# Patient Record
Sex: Female | Born: 1960 | Race: White | Hispanic: No | Marital: Married | Smoking: Current every day smoker
Health system: Southern US, Community
[De-identification: ages and names within clinical notes are randomized; demographics above are authoritative.]

## PROBLEM LIST (undated history)

## (undated) DIAGNOSIS — R51 Headache: Secondary | ICD-10-CM

## (undated) DIAGNOSIS — K635 Polyp of colon: Secondary | ICD-10-CM

## (undated) DIAGNOSIS — T7840XA Allergy, unspecified, initial encounter: Secondary | ICD-10-CM

## (undated) DIAGNOSIS — G43909 Migraine, unspecified, not intractable, without status migrainosus: Secondary | ICD-10-CM

## (undated) DIAGNOSIS — K219 Gastro-esophageal reflux disease without esophagitis: Secondary | ICD-10-CM

## (undated) DIAGNOSIS — R55 Syncope and collapse: Secondary | ICD-10-CM

## (undated) HISTORY — DX: Polyp of colon: K63.5

## (undated) HISTORY — DX: Allergy, unspecified, initial encounter: T78.40XA

## (undated) HISTORY — DX: Migraine, unspecified, not intractable, without status migrainosus: G43.909

## (undated) HISTORY — DX: Gastro-esophageal reflux disease without esophagitis: K21.9

## (undated) HISTORY — DX: Headache: R51

## (undated) HISTORY — DX: Syncope and collapse: R55

---

## 1983-11-12 HISTORY — PX: TONSILLECTOMY AND ADENOIDECTOMY: SHX28

## 1995-11-12 HISTORY — PX: NASAL SINUS SURGERY: SHX719

## 2010-11-11 HISTORY — PX: BREAST BIOPSY: SHX20

## 2011-09-27 ENCOUNTER — Other Ambulatory Visit: Payer: Self-pay | Admitting: Obstetrics and Gynecology

## 2011-09-27 DIAGNOSIS — N6009 Solitary cyst of unspecified breast: Secondary | ICD-10-CM

## 2011-09-30 ENCOUNTER — Other Ambulatory Visit: Payer: Self-pay | Admitting: Obstetrics and Gynecology

## 2011-10-02 ENCOUNTER — Other Ambulatory Visit: Payer: Self-pay | Admitting: Obstetrics and Gynecology

## 2011-10-02 ENCOUNTER — Ambulatory Visit
Admission: RE | Admit: 2011-10-02 | Discharge: 2011-10-02 | Disposition: A | Discharge status: Home / Self Care | Payer: BC Managed Care – PPO | Source: Ambulatory Visit | Attending: Obstetrics and Gynecology | Admitting: Obstetrics and Gynecology

## 2011-10-02 DIAGNOSIS — N6009 Solitary cyst of unspecified breast: Secondary | ICD-10-CM

## 2011-10-02 DIAGNOSIS — N6310 Unspecified lump in the right breast, unspecified quadrant: Secondary | ICD-10-CM

## 2012-03-11 HISTORY — PX: TOTAL ABDOMINAL HYSTERECTOMY W/ BILATERAL SALPINGOOPHORECTOMY: SHX83

## 2012-06-18 ENCOUNTER — Other Ambulatory Visit: Payer: Self-pay | Admitting: Obstetrics and Gynecology

## 2012-06-18 DIAGNOSIS — Z1231 Encounter for screening mammogram for malignant neoplasm of breast: Secondary | ICD-10-CM

## 2012-08-31 ENCOUNTER — Telehealth: Payer: Self-pay | Admitting: Internal Medicine

## 2012-08-31 NOTE — Telephone Encounter (Signed)
Only if spot is available.  Do not take up urgent slots.

## 2012-08-31 NOTE — Telephone Encounter (Signed)
Mr Claudia Young called wanting to see if we could move Claudia Young (his wife) appointment sooner than 12/23/12. Claudia Young went to her obgyn ([place]) Friday.  They told her she was anemic and her hemoglobin count was down didn't know what it was Pt just had hystermotery in July.   Spouse stated Claudia Young is very tired night has sweats not feeling good Please advise if we can see sooner.

## 2012-09-01 NOTE — Telephone Encounter (Signed)
Spoke with Mr. Claudia Young about appointment that was moved up to December.

## 2012-09-25 ENCOUNTER — Ambulatory Visit
Admission: RE | Admit: 2012-09-25 | Discharge: 2012-09-25 | Disposition: A | Discharge status: Home / Self Care | Payer: BC Managed Care – PPO | Source: Ambulatory Visit | Attending: Obstetrics and Gynecology | Admitting: Obstetrics and Gynecology

## 2012-09-25 DIAGNOSIS — Z1231 Encounter for screening mammogram for malignant neoplasm of breast: Secondary | ICD-10-CM

## 2012-10-28 ENCOUNTER — Encounter: Payer: Self-pay | Admitting: Internal Medicine

## 2012-10-28 ENCOUNTER — Ambulatory Visit (INDEPENDENT_AMBULATORY_CARE_PROVIDER_SITE_OTHER): Payer: BC Managed Care – PPO | Admitting: Internal Medicine

## 2012-10-28 VITALS — BP 98/60 | HR 97 | Temp 98.2°F | Resp 12 | Ht 61.0 in | Wt 149.8 lb

## 2012-10-28 DIAGNOSIS — E559 Vitamin D deficiency, unspecified: Secondary | ICD-10-CM

## 2012-10-28 DIAGNOSIS — Z7189 Other specified counseling: Secondary | ICD-10-CM

## 2012-10-28 DIAGNOSIS — Z13220 Encounter for screening for lipoid disorders: Secondary | ICD-10-CM

## 2012-10-28 DIAGNOSIS — F4320 Adjustment disorder, unspecified: Secondary | ICD-10-CM

## 2012-10-28 DIAGNOSIS — J329 Chronic sinusitis, unspecified: Secondary | ICD-10-CM

## 2012-10-28 DIAGNOSIS — Z716 Tobacco abuse counseling: Secondary | ICD-10-CM

## 2012-10-28 DIAGNOSIS — K219 Gastro-esophageal reflux disease without esophagitis: Secondary | ICD-10-CM

## 2012-10-28 DIAGNOSIS — D649 Anemia, unspecified: Secondary | ICD-10-CM

## 2012-10-28 DIAGNOSIS — F17200 Nicotine dependence, unspecified, uncomplicated: Secondary | ICD-10-CM

## 2012-10-28 DIAGNOSIS — D509 Iron deficiency anemia, unspecified: Secondary | ICD-10-CM

## 2012-10-28 DIAGNOSIS — R5383 Other fatigue: Secondary | ICD-10-CM

## 2012-10-28 DIAGNOSIS — F4321 Adjustment disorder with depressed mood: Secondary | ICD-10-CM

## 2012-10-28 DIAGNOSIS — N951 Menopausal and female climacteric states: Secondary | ICD-10-CM

## 2012-10-28 LAB — CBC WITH DIFFERENTIAL/PLATELET
Basophils Absolute: 0.1 10*3/uL (ref 0.0–0.1)
Hemoglobin: 12.2 g/dL (ref 12.0–15.0)
Lymphocytes Relative: 23.4 % (ref 12.0–46.0)
Monocytes Relative: 6.2 % (ref 3.0–12.0)
Neutro Abs: 6.5 10*3/uL (ref 1.4–7.7)
Neutrophils Relative %: 68.8 % (ref 43.0–77.0)
RBC: 4.72 Mil/uL (ref 3.87–5.11)
RDW: 26.1 % — ABNORMAL HIGH (ref 11.5–14.6)

## 2012-10-28 LAB — COMPREHENSIVE METABOLIC PANEL
ALT: 20 U/L (ref 0–35)
AST: 20 U/L (ref 0–37)
Albumin: 4.6 g/dL (ref 3.5–5.2)
Calcium: 9.6 mg/dL (ref 8.4–10.5)
Chloride: 107 mEq/L (ref 96–112)
Potassium: 4.5 mEq/L (ref 3.5–5.1)

## 2012-10-28 LAB — IRON AND TIBC
%SAT: 7 % — ABNORMAL LOW (ref 20–55)
TIBC: 391 ug/dL (ref 250–470)
UIBC: 364 ug/dL (ref 125–400)

## 2012-10-28 LAB — LIPID PANEL
Cholesterol: 216 mg/dL — ABNORMAL HIGH (ref 0–200)
HDL: 53.9 mg/dL (ref >39.00)
Total CHOL/HDL Ratio: 4
VLDL: 13 mg/dL (ref 0.0–40.0)

## 2012-10-28 LAB — FERRITIN: Ferritin: 10.9 ng/mL (ref 10.0–291.0)

## 2012-10-28 MED ORDER — OXYCODONE-ACETAMINOPHEN 7.5-325 MG PO TABS: 1.0000 | ORAL_TABLET | ORAL | Status: DC | PRN

## 2012-10-28 MED ORDER — CLONAZEPAM 1 MG PO TABS: 1.0000 mg | ORAL_TABLET | Freq: Two times a day (BID) | ORAL | Status: DC | PRN

## 2012-10-28 MED ORDER — OMEPRAZOLE 40 MG PO CPDR: 40.0000 mg | DELAYED_RELEASE_CAPSULE | Freq: Every day | ORAL | Status: DC

## 2012-10-28 NOTE — Patient Instructions (Addendum)
You can continue to use the Klonipin twice daily for your nerves  But if you are still needing it that often in another month or so we should talk about a safer alternative   CT of sinuses prior to ENT evaluation   Increase your salt water flushes to twice daily  Resume your steroid nasal spray   We will notify you of your lab results.

## 2012-10-28 NOTE — Progress Notes (Signed)
Patient ID: Claudia Young, female   DOB: [birth year], 51 y.o.   MRN: [id number]    Patient Active Problem List  Diagnosis  . Anemia, iron deficiency  . Chronic sinusitis  . Grief reaction  . Tobacco abuse counseling    Subjective:  CC:   Chief Complaint  Patient presents with  . Establish Care    HPI:   Claudia Young is a 51 y.o. female who presents as a new patient to establish primary care with the chief complaint of  1) Grief .  She was recently widowed.  Her husband Claudia Young died less than one month ago at home from an  AMI.  She has been sleeping fitfully and tking clonazepam for nerves since husband died.  Using prn twice daily at most 3 on weekend.  2) Chronic sinus pain and congestion.  History of 2 prior surgeries done at [facility] in [place], but her last surgery was over 15 yrs ago for polyps.  Persistent right maxillary pain.  Had a dental exam recently with pantograms to rule out teeth as source. 3) Anemia  Has ahistory of iron deficiency due to DUB.  She is now s.p hysterectomy/BSO  Last hgb was 9 end of oct. She has had difficulty adhering to tid oral iron due to nausea from the medication.  4) chronic pain.  She has  been taking percocet lately for headaches  and shoulder pain which have been worse lately.     Past Medical History  Diagnosis Date  . Fainting spell   . Headache   . GERD (gastroesophageal reflux disease)   . Allergy   . Migraine   . Colon polyps     Past Surgical History  Procedure Date  . Breast biopsy 2012  . Tonsillectomy and adenoidectomy 1985  . Nasal sinus surgery 1997  . Total abdominal hysterectomy w/ bilateral salpingoophorectomy May 2013    [facility] , [place]    Family History  Problem Relation Age of Onset  . Hypertension Mother   . Hyperlipidemia Mother   . Cancer Father 68    prostate  . Cancer Maternal Aunt     breast  . Cancer Paternal Grandmother     breast   No Known Allergies  History   Social History   . Marital Status: Married    Spouse Name: N/A    Number of Children: N/A  . Years of Education: N/A   Occupational History  . Not on file.   Social History Main Topics  . Smoking status: Current Every Day Smoker  . Smokeless tobacco: Not on file  . Alcohol Use: No  . Drug Use: No  . Sexually Active: Yes -- Female partner(s)    Birth Control/ Protection: Surgical   Other Topics Concern  . 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     Objective:  BP 98/60  Pulse 97  Temp 98.2 F (36.8 C) (Oral)  Resp 12  Ht 5\' 1"  (1.549 m)  Wt 149 lb 12 oz (67.926 kg)  BMI 28.29 kg/m2  SpO2 93%  General appearance: alert, cooperative and appears stated age Ears: normal TM's and external ear canals both ears Throat: lips, mucosa, and tongue normal; teeth and gums  normal Neck: no adenopathy, no carotid bruit, supple, symmetrical, trachea midline and thyroid not enlarged, symmetric, no tenderness/mass/nodules Back: symmetric, no curvature. ROM normal. No CVA tenderness. Lungs: clear to auscultation bilaterally Heart: regular rate and rhythm, S1, S2 normal, no murmur, click, rub or gallop Abdomen: soft, non-tender; bowel sounds normal; no masses,  no organomegaly Pulses: 2+ and symmetric Skin: Skin color, texture, turgor normal. No rashes or lesions Lymph nodes: Cervical, supraclavicular, and axillary nodes normal.  Assessment and Plan:  Anemia, iron deficiency Presumed secondary toRepeat hemoglobin is normal at 12 despite patient's inability to tolerate ferrous sulfate. Her iron studies remain low we will try a course of tendon.  gynecologic blood losses since EGD and colonoscopy were unrevealing.  Chronic sinusitis CT of sinuses ordered and referral to [facility] .  Recommended to flush sinuses twice daily as simply saline and consider  Grief reaction She has been taking Klonopin 2-3 times daily since the death of her husband  Claudia Young, .which was quite sudden.  I discussed the nature of this medication in the high date is potential that it has. Suggested that she come back in one month and if she is still requiring use of the medication on  twice daily basis that we should start an SSRI and try to taper her use to once daily.  Tobacco abuse counseling Spent 3 minutes discussing risk of continued tobacco abuse, including but not limited to CAD, PAD, hypertension, and CA.   Given her concurrent grief and depression/anxiety we discussed possibility of a trial of Wellbutrin to her next visit.  Menopause syndrome She is using estrogen gel for vaginal atrophy. Oral estrogen is contraindicated due to her current tobacco use.   Updated Medication List Outpatient Encounter Prescriptions as of 10/28/2012  Medication Sig Dispense Refill  . clonazePAM (KLONOPIN) 1 MG tablet Take 1 tablet (1 mg total) by mouth 2 (two) times daily as needed.  60 tablet  2  . Estradiol (ELESTRIN) 0.52 MG/0.87 GM (0.06%) GEL Apply topically.      Marland Kitchen oxyCODONE-acetaminophen (PERCOCET) 7.5-325 MG per tablet Take 1 tablet by mouth every 4 (four) hours as needed.  30 tablet  0  . [DISCONTINUED] clonazePAM (KLONOPIN) 1 MG tablet Take 1 mg by mouth 2 (two) times daily as needed.      . [DISCONTINUED] ferrous sulfate 325 (65 FE) MG tablet Take 325 mg by mouth daily with breakfast.      . [DISCONTINUED] omeprazole (PRILOSEC) 20 MG capsule Take 20 mg by mouth daily.      . [DISCONTINUED] oxyCODONE-acetaminophen (PERCOCET) 7.5-325 MG per tablet Take 1 tablet by mouth every 4 (four) hours as needed.      . Ferrous Fum-Iron Polysacch-FA 162-115.2-1 MG CAPS Take 1 capsule by mouth daily with supper.  30 each  1  . omeprazole (PRILOSEC) 40 MG capsule Take 1 capsule (40 mg total) by mouth daily.  30  capsule  3

## 2012-10-29 ENCOUNTER — Encounter: Payer: Self-pay | Admitting: Internal Medicine

## 2012-10-29 DIAGNOSIS — D509 Iron deficiency anemia, unspecified: Secondary | ICD-10-CM | POA: Insufficient documentation

## 2012-10-29 DIAGNOSIS — N951 Menopausal and female climacteric states: Secondary | ICD-10-CM | POA: Insufficient documentation

## 2012-10-29 DIAGNOSIS — F4321 Adjustment disorder with depressed mood: Secondary | ICD-10-CM | POA: Insufficient documentation

## 2012-10-29 DIAGNOSIS — J329 Chronic sinusitis, unspecified: Secondary | ICD-10-CM | POA: Insufficient documentation

## 2012-10-29 DIAGNOSIS — Z716 Tobacco abuse counseling: Secondary | ICD-10-CM | POA: Insufficient documentation

## 2012-10-29 MED ORDER — FERROUS FUM-IRON POLYSACCH-FA 162-115.2-1 MG PO CAPS: 1.0000 | ORAL_CAPSULE | Freq: Every day | ORAL | Status: DC

## 2012-10-29 NOTE — Assessment & Plan Note (Signed)
Spent 3 minutes discussing risk of continued tobacco abuse, including but not limited to CAD, PAD, hypertension, and CA.   Given her concurrent grief and depression/anxiety we discussed possibility of a trial of Wellbutrin to her next visit.

## 2012-10-29 NOTE — Assessment & Plan Note (Signed)
She is using estrogen gel for vaginal atrophy. Oral estrogen is contraindicated due to her current tobacco use.

## 2012-10-29 NOTE — Assessment & Plan Note (Signed)
Presumed secondary toRepeat hemoglobin is normal at 12 despite patient's inability to tolerate ferrous sulfate. Her iron studies remain low we will try a course of tendon.  gynecologic blood losses since EGD and colonoscopy were unrevealing.

## 2012-10-29 NOTE — Assessment & Plan Note (Signed)
She has been taking Klonopin 2-3 times daily since the death of her husband  Claudia Young, .which was quite sudden.  I discussed the nature of this medication in the high date is potential that it has. Suggested that she come back in one month and if she is still requiring use of the medication on  twice daily basis that we should start an SSRI and try to taper her use to once daily.

## 2012-10-29 NOTE — Assessment & Plan Note (Addendum)
CT of sinuses ordered and referral to [facility] ENT . Recommended to flush sinuses twice daily as simply saline and consider

## 2012-10-30 ENCOUNTER — Other Ambulatory Visit: Payer: Self-pay

## 2012-11-02 ENCOUNTER — Telehealth: Payer: Self-pay | Admitting: Internal Medicine

## 2012-11-02 ENCOUNTER — Ambulatory Visit: Payer: Self-pay | Admitting: Internal Medicine

## 2012-11-02 NOTE — Telephone Encounter (Signed)
Called pt. LMOVM 

## 2012-11-02 NOTE — Telephone Encounter (Signed)
Does Pt need to keep appt with ENT?

## 2012-11-02 NOTE — Telephone Encounter (Signed)
I think this was supposed to come to you.  thanks

## 2012-11-02 NOTE — Telephone Encounter (Signed)
CT sinuses showed mucoperiosteal thickening, but no air fluid levels consistent with acute sinusitis.

## 2012-11-02 NOTE — Telephone Encounter (Signed)
Yes, please have pt keep appt with ENT at [facility] as changes are consistent with chronic inflammation in the sinuses.

## 2012-11-03 NOTE — Telephone Encounter (Signed)
Left message asking patient to call back

## 2012-11-03 NOTE — Telephone Encounter (Signed)
Pt notified to keep appt.

## 2012-11-18 ENCOUNTER — Encounter: Payer: Self-pay | Admitting: Internal Medicine

## 2012-11-24 ENCOUNTER — Other Ambulatory Visit: Payer: Self-pay | Admitting: General Practice

## 2012-11-24 DIAGNOSIS — D509 Iron deficiency anemia, unspecified: Secondary | ICD-10-CM

## 2012-11-24 MED ORDER — FERROUS FUM-IRON POLYSACCH-FA 162-115.2-1 MG PO CAPS: 1.0000 | ORAL_CAPSULE | Freq: Every day | ORAL | Status: DC

## 2012-11-25 ENCOUNTER — Telehealth: Payer: Self-pay | Admitting: General Practice

## 2012-11-25 MED ORDER — TANDEM PLUS 162-115.2-1 MG PO CAPS: 1.0000 | ORAL_CAPSULE | Freq: Every day | ORAL | Status: DC

## 2012-11-25 NOTE — Telephone Encounter (Signed)
Rx sent. If they do not have the alternative,  Tell them to pick an iron supplement they DO have.

## 2012-11-25 NOTE — Telephone Encounter (Signed)
Pharmacy called stating the Rx sent to them regarding Ferrous Fum-Iron Polysacch-FA 162-115.2-1 MG CAPS cannot be filled. The requested med be changed to something else.

## 2012-11-26 NOTE — Telephone Encounter (Signed)
Pt pharmacy called and advised.

## 2012-12-04 ENCOUNTER — Other Ambulatory Visit: Payer: Self-pay | Admitting: *Deleted

## 2012-12-04 ENCOUNTER — Other Ambulatory Visit: Payer: Self-pay | Admitting: Internal Medicine

## 2012-12-04 DIAGNOSIS — K219 Gastro-esophageal reflux disease without esophagitis: Secondary | ICD-10-CM

## 2012-12-04 MED ORDER — OMEPRAZOLE 40 MG PO CPDR: 40.0000 mg | DELAYED_RELEASE_CAPSULE | Freq: Every day | ORAL | Status: DC

## 2012-12-04 NOTE — Addendum Note (Signed)
Addended by: Jackson Latino on: 12/04/2012 05:55 PM   Modules accepted: Orders

## 2012-12-04 NOTE — Telephone Encounter (Signed)
Form given to [facility]

## 2012-12-04 NOTE — Telephone Encounter (Signed)
Have not seen this pt

## 2012-12-04 NOTE — Telephone Encounter (Signed)
Forwarded to Dr. Lorin Picket

## 2012-12-04 NOTE — Telephone Encounter (Signed)
omeprazole (PRILOSEC) 40 MG capsule  # 30  Prior authorization request  In physicians box

## 2012-12-10 ENCOUNTER — Telehealth: Payer: Self-pay | Admitting: *Deleted

## 2012-12-10 NOTE — Telephone Encounter (Signed)
Called [phone] for prior authorization form is being faxed over case # [id number]

## 2012-12-15 ENCOUNTER — Other Ambulatory Visit: Payer: Self-pay | Admitting: General Practice

## 2012-12-15 DIAGNOSIS — K219 Gastro-esophageal reflux disease without esophagitis: Secondary | ICD-10-CM

## 2012-12-15 MED ORDER — OMEPRAZOLE 40 MG PO CPDR: 40.0000 mg | DELAYED_RELEASE_CAPSULE | Freq: Every day | ORAL | Status: DC

## 2012-12-15 NOTE — Telephone Encounter (Signed)
PA for Omeprazole has been approved from 11/13/12 until 12/14/13

## 2012-12-21 ENCOUNTER — Telehealth: Payer: Self-pay | Admitting: General Practice

## 2012-12-21 DIAGNOSIS — M26609 Unspecified temporomandibular joint disorder, unspecified side: Secondary | ICD-10-CM | POA: Insufficient documentation

## 2012-12-21 MED ORDER — NABUMETONE 750 MG PO TABS: 750.0000 mg | ORAL_TABLET | Freq: Two times a day (BID) | ORAL | Status: DC | PRN

## 2012-12-21 MED ORDER — METHOCARBAMOL 500 MG PO TABS: 500.0000 mg | ORAL_TABLET | Freq: Three times a day (TID) | ORAL | Status: DC

## 2012-12-21 NOTE — Telephone Encounter (Signed)
TMJ usually responds to anti inflammatories and muscle relaxers,  I will call one of each to her pharmacy and let her try them. The muscle relaxer  (methocarbamol) may make her sleepy so she should save it for night.

## 2012-12-21 NOTE — Addendum Note (Signed)
Addended by: Sherlene Shams on: 12/21/2012 05:30 PM   Modules accepted: Orders

## 2012-12-21 NOTE — Telephone Encounter (Signed)
See unrouted response 

## 2012-12-21 NOTE — Telephone Encounter (Signed)
Pt called stating she has been getting bad stress headaches and was wondering if anything could be called in.   Pt had an ENT appointment today stated that it wasn't sinuses that are causing pain in face thought it was TMJ.

## 2012-12-22 NOTE — Telephone Encounter (Signed)
Pt.notified

## 2012-12-23 ENCOUNTER — Ambulatory Visit: Payer: BC Managed Care – PPO | Admitting: Internal Medicine

## 2013-01-28 ENCOUNTER — Telehealth: Payer: Self-pay | Admitting: Internal Medicine

## 2013-01-28 NOTE — Telephone Encounter (Signed)
Claudia Young spoke with pt  Pt aware of appointment change

## 2013-01-28 NOTE — Telephone Encounter (Signed)
Left message for Claudia Young to call office Please advise Claudia Young appointment for 3/21 with dr Lorin Picket has been cancel and r/s with dr Darrick Huntsman on 02/01/13 @ 10:30

## 2013-01-29 ENCOUNTER — Ambulatory Visit: Payer: BC Managed Care – PPO | Admitting: Internal Medicine

## 2013-02-01 ENCOUNTER — Ambulatory Visit: Payer: BC Managed Care – PPO | Admitting: Internal Medicine

## 2013-02-05 ENCOUNTER — Ambulatory Visit: Payer: BC Managed Care – PPO | Admitting: Internal Medicine

## 2013-03-22 ENCOUNTER — Other Ambulatory Visit: Payer: Self-pay | Admitting: Internal Medicine

## 2013-03-22 NOTE — Telephone Encounter (Signed)
Please tell ms harris that she needs to reschedule the appt she [facility] and I will refill the clonazepam for one month.  We discussed the addictive potential of this medication at her first visit.  She is recently widowed and may need alternative safer medication to use long term.

## 2013-03-22 NOTE — Telephone Encounter (Signed)
Left message for patient to call office on voice mail 

## 2013-03-23 ENCOUNTER — Other Ambulatory Visit: Payer: Self-pay | Admitting: Internal Medicine

## 2013-03-23 NOTE — Telephone Encounter (Signed)
Needs follow up first

## 2013-03-23 NOTE — Telephone Encounter (Signed)
Patient notified medication cannot be refilled until appointment.

## 2013-03-23 NOTE — Telephone Encounter (Signed)
Patient scheduled follw up appointment for medication refill.

## 2013-03-23 NOTE — Telephone Encounter (Signed)
Patient has not had office visit since 12/13 has scheduled visit 04/16/13 please advise.

## 2013-03-23 NOTE — Telephone Encounter (Signed)
Called patient to advise of need for follow up left message to call office.

## 2013-03-25 NOTE — Telephone Encounter (Signed)
Left message for patient to return call on home voicemail.  

## 2013-04-16 ENCOUNTER — Ambulatory Visit (INDEPENDENT_AMBULATORY_CARE_PROVIDER_SITE_OTHER): Payer: BC Managed Care – PPO | Admitting: Internal Medicine

## 2013-04-16 ENCOUNTER — Encounter: Payer: Self-pay | Admitting: Internal Medicine

## 2013-04-16 VITALS — BP 106/68 | HR 74 | Temp 98.7°F | Resp 16 | Wt 150.5 lb

## 2013-04-16 DIAGNOSIS — M26609 Unspecified temporomandibular joint disorder, unspecified side: Secondary | ICD-10-CM

## 2013-04-16 DIAGNOSIS — F329 Major depressive disorder, single episode, unspecified: Secondary | ICD-10-CM | POA: Insufficient documentation

## 2013-04-16 DIAGNOSIS — J329 Chronic sinusitis, unspecified: Secondary | ICD-10-CM

## 2013-04-16 MED ORDER — AMITRIPTYLINE HCL 25 MG PO TABS: 25.0000 mg | ORAL_TABLET | Freq: Every day | ORAL | Status: DC

## 2013-04-16 MED ORDER — MOMETASONE FUROATE 50 MCG/ACT NA SUSP: 4.0000 | Freq: Every day | NASAL | Status: DC

## 2013-04-16 MED ORDER — BUPROPION HCL ER (SR) 100 MG PO TB12: 100.0000 mg | ORAL_TABLET | Freq: Two times a day (BID) | ORAL | Status: DC

## 2013-04-16 MED ORDER — ALPRAZOLAM 0.5 MG PO TABS: 0.5000 mg | ORAL_TABLET | Freq: Two times a day (BID) | ORAL | Status: DC | PRN

## 2013-04-16 NOTE — Patient Instructions (Addendum)
I am recommending a trial of wellbutrin,  An antidepressant,  And alprazolam for your insomnia.  The wellbutrin will help your energy level and your tobacco cravings.   For your sinuses and alleriges,  I recommend trial of nasonex steorid spray 2 squirts in reach nostril daily   Trial of amitryptine  One hour before bedtime for headache prevention  May take the alprazolam if still awake one hour later

## 2013-04-16 NOTE — Assessment & Plan Note (Addendum)
With lethargy, fatigue and insomnia  , triggered by husband Claudia Young's unexpected death in 10-04-2023. Still tearful, given her tobacco history   Trial of wellbutrin and alprazolam for insomnia.

## 2013-04-16 NOTE — Progress Notes (Signed)
Patient ID: Claudia Young, female   DOB: 08/01/1961, 52 y.o.   MRN: [id number]  Patient Active Problem List   Diagnosis Date Noted  . Depression 04/16/2013  . TMJ (temporomandibular joint disorder) 12/21/2012  . Anemia, iron deficiency 10/29/2012  . Chronic sinusitis 10/29/2012  . Grief reaction 10/29/2012  . Tobacco abuse counseling 10/29/2012  . Menopause syndrome 10/29/2012    Subjective:  CC:   Chief Complaint  Patient presents with  . Follow-up    6 month    HPI:   Claudia Young a 52 y.o. female who presents for 6 month follow up on chronic conditions.  She has returned to work but continues to have difficulty with prolonged grief after being widowed in Nov 2013.  She is not sleeping well and endorses depressive symptoms .  She has been getting gastritis from Aleve which she has been taking twice daily for recurrent headaches and TMJ.  She is also having sinus congestion and cough.     Past Medical History  Diagnosis Date  . Fainting spell   . Headache(784.0)   . GERD (gastroesophageal reflux disease)   . Allergy   . Migraine   . Colon polyps     Past Surgical History  Procedure Laterality Date  . Breast biopsy  2012  . Tonsillectomy and adenoidectomy  1985  . Nasal sinus surgery  1997  . Total abdominal hysterectomy w/ bilateral salpingoophorectomy  May 2013    [facility] , [place]       The following portions of the patient's history were reviewed and updated as appropriate: Allergies, current medications, and problem list.    Review of Systems:    Patient denies headache, fevers, malaise, unintentional weight loss, skin rash, eye pain, sinus congestion and sinus pain, sore throat, dysphagia,  hemoptysis , cough, dyspnea, wheezing, chest pain, palpitations, orthopnea, edema, abdominal pain, nausea, melena, diarrhea, constipation, flank pain, dysuria, hematuria, urinary  Frequency, nocturia, numbness, tingling, seizures,  Focal weakness, Loss  of consciousness,  Tremor, insomnia, depression, anxiety, and suicidal ideation.     History   Social History  . Marital Status: Married    Spouse Name: N/A    Number of Children: N/A  . Years of Education: N/A   Occupational History  . Not on file.   Social History Main Topics  . Smoking status: Current Every Day Smoker  . Smokeless tobacco: Not on file  . Alcohol Use: No  . Drug Use: No  . Sexually Active: Yes -- Female partner(s)    Birth Control/ Protection: Surgical   Other Topics Concern  . Not on file   Social History Narrative   Widowed of Aleatha Young Nov 2013   Works as an Technical sales engineer at [employer]     Objective:  BP 106/68  Pulse 74  Temp(Src) 98.7 F (37.1 C) (Oral)  Resp 16  Wt 150 lb 8 oz (68.266 kg)  BMI 28.45 kg/m2  SpO2 98%  General appearance: alert, cooperative and appears stated age Ears: normal TM's and external ear canals both ears Throat: lips, mucosa, and tongue normal; teeth and gums normal Neck: no adenopathy, no carotid bruit, supple, symmetrical, trachea midline and thyroid not enlarged, symmetric, no tenderness/mass/nodules Back: symmetric, no curvature. ROM normal. No CVA tenderness. Lungs: clear to auscultation bilaterally Heart: regular rate and rhythm, S1, S2 normal, no murmur, click, rub or gallop Abdomen: soft, non-tender; bowel sounds normal; no masses,  no organomegaly Pulses: 2+ and symmetric Skin:  Skin color, texture, turgor normal. No rashes or lesions Lymph nodes: Cervical, supraclavicular, and axillary nodes normal.  Assessment and Plan:  Depression With lethargy, fatigue and insomnia  , triggered by husband Claudia's unexpected death in September 28, 2023. Still tearful, given her tobacco history   Trial of wellbutrin and alprazolam for insomnia.   TMJ (temporomandibular joint disorder) She is using Aleve and getting gastritis.  Resume methocarbamol and initiate trial of elavil at bedtime   Chronic  sinusitis Symptoms are persistent.  Recommended tobacco cessation and saline flushes.    Updated Medication List Outpatient Encounter Prescriptions as of 04/16/2013  Medication Sig Dispense Refill  . methocarbamol (ROBAXIN) 500 MG tablet Take 1 tablet (500 mg total) by mouth 3 (three) times daily.  60 tablet  1  . nabumetone (RELAFEN) 750 MG tablet Take 1 tablet (750 mg total) by mouth 2 (two) times daily as needed for pain (headache).  60 tablet  2  . omeprazole (PRILOSEC) 40 MG capsule Take 1 capsule (40 mg total) by mouth daily.  30 capsule  3  . [DISCONTINUED] clonazePAM (KLONOPIN) 1 MG tablet take 1 tablet by mouth twice a day if needed  60 tablet  0  . ALPRAZolam (XANAX) 0.5 MG tablet Take 1 tablet (0.5 mg total) by mouth 2 (two) times daily as needed for sleep or anxiety.  30 tablet  3  . amitriptyline (ELAVIL) 25 MG tablet Take 1 tablet (25 mg total) by mouth at bedtime.  30 tablet  3  . buPROPion (WELLBUTRIN SR) 100 MG 12 hr tablet Take 1 tablet (100 mg total) by mouth 2 (two) times daily.  60 tablet  3  . Estradiol (ELESTRIN) 0.52 MG/0.87 GM (0.06%) GEL Apply topically.      . FeFum-FePo-FA-B Cmp-C-Zn-Mn-Cu (TANDEM PLUS) 162-115.2-1 MG CAPS Take 1 tablet by mouth daily after lunch.  60 each  0  . Ferrous Fum-Iron Polysacch-FA 162-115.2-1 MG CAPS Take 1 capsule by mouth daily with supper.  30 each  1  . mometasone (NASONEX) 50 MCG/ACT nasal spray Place 4 sprays into the nose daily.  17 g  12  . oxyCODONE-acetaminophen (PERCOCET) 7.5-325 MG per tablet Take 1 tablet by mouth every 4 (four) hours as needed.  30 tablet  0  . [DISCONTINUED] amitriptyline (ELAVIL) 25 MG tablet Take 1 tablet (25 mg total) by mouth at bedtime.  30 tablet  3   No facility-administered encounter medications on file as of 04/16/2013.     No orders of the defined types were placed in this encounter.    No Follow-up on file.

## 2013-04-18 ENCOUNTER — Encounter: Payer: Self-pay | Admitting: Internal Medicine

## 2013-04-18 NOTE — Assessment & Plan Note (Signed)
Symptoms are persistent.  Recommended tobacco cessation and saline flushes.

## 2013-04-18 NOTE — Assessment & Plan Note (Addendum)
She is using Aleve and getting gastritis.  Resume methocarbamol and initiate trial of elavil at bedtime

## 2013-05-25 ENCOUNTER — Telehealth: Payer: Self-pay | Admitting: *Deleted

## 2013-05-25 DIAGNOSIS — F418 Other specified anxiety disorders: Secondary | ICD-10-CM

## 2013-05-25 NOTE — Telephone Encounter (Signed)
Patient called and sated the medication for sleep is not working and was wondering if there is something else she can try.

## 2013-05-25 NOTE — Telephone Encounter (Signed)
She has several medications she may be referring to. Please asked to be more specific. Please find out everything she is taking in the evening.

## 2013-05-26 MED ORDER — TRAZODONE HCL 50 MG PO TABS: 25.0000 mg | ORAL_TABLET | Freq: Every evening | ORAL | Status: DC | PRN

## 2013-05-26 NOTE — Telephone Encounter (Signed)
Left message for pt to return my call.

## 2013-05-26 NOTE — Telephone Encounter (Signed)
Patient says the Amitriptyline make her feel spaced out the next day she stopped taking and does not feel this way since stopping and the Alprazolam 0.5 mg helps but she still only gets approximately 3 hours sleep a night in succession.

## 2013-05-26 NOTE — Telephone Encounter (Signed)
I would prefer her to try trazodone flor her insomnia and stop the alprazolam and amitryptiline.  rx sent to pharmacy

## 2013-05-27 NOTE — Telephone Encounter (Signed)
Left message to call back  

## 2013-05-28 NOTE — Telephone Encounter (Signed)
Left message for patient to return call.

## 2013-05-28 NOTE — Telephone Encounter (Signed)
Patient notified and voiced understanding.

## 2013-08-27 ENCOUNTER — Telehealth: Payer: Self-pay | Admitting: Internal Medicine

## 2013-08-27 ENCOUNTER — Ambulatory Visit (INDEPENDENT_AMBULATORY_CARE_PROVIDER_SITE_OTHER): Payer: BC Managed Care – PPO | Admitting: Internal Medicine

## 2013-08-27 VITALS — BP 106/66 | HR 76 | Temp 98.4°F | Resp 12 | Wt 154.5 lb

## 2013-08-27 DIAGNOSIS — Z716 Tobacco abuse counseling: Secondary | ICD-10-CM

## 2013-08-27 DIAGNOSIS — F4321 Adjustment disorder with depressed mood: Secondary | ICD-10-CM

## 2013-08-27 DIAGNOSIS — F17200 Nicotine dependence, unspecified, uncomplicated: Secondary | ICD-10-CM

## 2013-08-27 DIAGNOSIS — J441 Chronic obstructive pulmonary disease with (acute) exacerbation: Secondary | ICD-10-CM | POA: Insufficient documentation

## 2013-08-27 DIAGNOSIS — J329 Chronic sinusitis, unspecified: Secondary | ICD-10-CM

## 2013-08-27 DIAGNOSIS — Z7189 Other specified counseling: Secondary | ICD-10-CM

## 2013-08-27 MED ORDER — PREDNISONE (PAK) 10 MG PO TABS: ORAL_TABLET | ORAL | Status: AC

## 2013-08-27 MED ORDER — LEVOFLOXACIN 500 MG PO TABS: 500.0000 mg | ORAL_TABLET | Freq: Every day | ORAL | Status: AC

## 2013-08-27 MED ORDER — HYDROCOD POLST-CHLORPHEN POLST 10-8 MG/5ML PO LQCR: 10.0000 mL | Freq: Every evening | ORAL | Status: AC | PRN

## 2013-08-27 MED ORDER — IPRATROPIUM-ALBUTEROL 18-103 MCG/ACT IN AERO: 2.0000 | INHALATION_SPRAY | Freq: Four times a day (QID) | RESPIRATORY_TRACT | Status: DC | PRN

## 2013-08-27 MED ORDER — CLONAZEPAM 1 MG PO TABS: 1.0000 mg | ORAL_TABLET | Freq: Two times a day (BID) | ORAL | Status: DC | PRN

## 2013-08-27 NOTE — Patient Instructions (Signed)
I am prescribing you a prednisone taper, a 7 day course of levaquin,  And albuterol inhaler if needed for wheezing/chest tightness (use every 6 hours if needed).  Use the tussionex for the nighttime cough.   Use the old robitussin or Delsym OTC for daytime cough  You should use either sudafed PE/afrin for nasal congestion or Afrin nasal spray twice daily for 5 days for the ear and sinus congestion   I also recommending using Simply Saline nasal spray twice daily to flush your sinuses out. (available OTC)  Please take a probiotic ( Align, Floraque or Culturelle) while you are on the antibiotic to prevent a serious antibiotic associated diarrhea  Called clostirudium dificile colitis and a vaginal yeast infection

## 2013-08-27 NOTE — Telephone Encounter (Signed)
Pt stopped up, scratchy throat, coughing spells x almost 2 weeks.  Pt asking for appt this afternoon.  Cannot come in before 12:00.

## 2013-08-27 NOTE — Assessment & Plan Note (Addendum)
Levaquin, prednisone tussionex and albuterol .  Tobacco cessation advised

## 2013-08-27 NOTE — Telephone Encounter (Signed)
Pt has appt for this afternoon per Dr. Darrick Huntsman

## 2013-08-27 NOTE — Progress Notes (Signed)
Patient ID: Claudia Young, female   DOB: [birth year], 52 y.o.   MRN: [id number]   Patient Active Problem List   Diagnosis Date Noted  . Obstructive chronic bronchitis with exacerbation 08/27/2013  . Depression 04/16/2013  . TMJ (temporomandibular joint disorder) 12/21/2012  . Anemia, iron deficiency 10/29/2012  . Chronic sinusitis 10/29/2012  . Grief reaction 10/29/2012  . Tobacco abuse counseling 10/29/2012  . Menopause syndrome 10/29/2012    Subjective:  CC:   Chief Complaint  Patient presents with  . Cough    x 1 week    HPI:   Claudia Young a 52 y.o. female who presents  Past Medical History  Diagnosis Date  . Fainting spell   . Headache(784.0)   . GERD (gastroesophageal reflux disease)   . Allergy   . Migraine   . Colon polyps     Past Surgical History  Procedure Laterality Date  . Breast biopsy  2012  . Tonsillectomy and adenoidectomy  1985  . Nasal sinus surgery  1997  . Total abdominal hysterectomy w/ bilateral salpingoophorectomy  May 2013    [facility] , [place]       The following portions of the patient's history were reviewed and updated as appropriate: Allergies, current medications, and problem list.    Review of Systems:   12 Pt  review of systems was negative except those addressed in the HPI,     History   Social History  . Marital Status: Married    Spouse Name: N/A    Number of Children: N/A  . Years of Education: N/A   Occupational History  . Not on file.   Social History Main Topics  . Smoking status: Current Every Day Smoker  . Smokeless tobacco: Not on file  . Alcohol Use: No  . Drug Use: No  . Sexual Activity: Yes    Partners: Male    Birth Control/ Protection: Surgical   Other Topics Concern  . Not on file   Social History Narrative   Widowed of Aleatha Borer Nov 2013   Works as an Technical sales engineer at [employer]     Objective:  Filed Vitals:   08/27/13 1329  BP: 106/66   Pulse: 76  Temp: 98.4 F (36.9 C)  Resp: 12     General appearance: alert, cooperative and appears stated age Ears: normal TM's and external ear canals both ears Throat: lips, mucosa, and tongue normal; teeth and gums normal Neck: no adenopathy, no carotid bruit, supple, symmetrical, trachea midline and thyroid not enlarged, symmetric, no tenderness/mass/nodules Back: symmetric, no curvature. ROM normal. No CVA tenderness. Lungs: decreased A/M with occasional expiratory wheezes and ronchi bilaterally.  No egophony Heart: regular rate and rhythm, S1, S2 normal, no murmur, click, rub or gallop Abdomen: soft, non-tender; bowel sounds normal; no masses,  no organomegaly Pulses: 2+ and symmetric Skin: Skin color, texture, turgor normal. No rashes or lesions Lymph nodes: Cervical, supraclavicular, and axillary nodes normal.  Assessment and Plan:  Obstructive chronic bronchitis with exacerbation Levaquin, prednisone tussionex and albuterol .  Tobacco cessation advised  Chronic sinusitis Advised to quit smoking an dirrigate sinuses regularly  Grief reaction recurrewnt secondary to husband's death one year ago.  Prn clonazepam refill given   Tobacco abuse counseling Smoking cessation instruction/counseling given:  counseled patient on the dangers of tobacco use, advised patient to stop smoking, and reviewed strategies to maximize success   Updated Medication List Outpatient Encounter Prescriptions as of 08/27/2013  Medication  Sig Dispense Refill  . omeprazole (PRILOSEC) 40 MG capsule Take 40 mg by mouth daily as needed.      . [DISCONTINUED] ALPRAZolam (XANAX) 0.5 MG tablet Take 1 tablet (0.5 mg total) by mouth 2 (two) times daily as needed for sleep or anxiety.  30 tablet  3  . albuterol-ipratropium (COMBIVENT) 18-103 MCG/ACT inhaler Inhale 2 puffs into the lungs every 6 (six) hours as needed for wheezing.  14.7 g  4  . chlorpheniramine-HYDROcodone (TUSSIONEX) 10-8 MG/5ML LQCR Take  10 mLs by mouth at bedtime as needed.  180 mL  0  . clonazePAM (KLONOPIN) 1 MG tablet Take 1 tablet (1 mg total) by mouth 2 (two) times daily as needed for anxiety.  30 tablet  1  . levofloxacin (LEVAQUIN) 500 MG tablet Take 1 tablet (500 mg total) by mouth daily.  7 tablet  0  . predniSONE (STERAPRED UNI-PAK) 10 MG tablet 6 tablets on Day 1 , then reduce by 1 tablet daily until gone  21 tablet  0  . [DISCONTINUED] buPROPion (WELLBUTRIN SR) 100 MG 12 hr tablet Take 1 tablet (100 mg total) by mouth 2 (two) times daily.  60 tablet  3  . [DISCONTINUED] Estradiol (ELESTRIN) 0.52 MG/0.87 GM (0.06%) GEL Apply topically.      . [DISCONTINUED] FeFum-FePo-FA-B Cmp-C-Zn-Mn-Cu (TANDEM PLUS) 162-115.2-1 MG CAPS Take 1 tablet by mouth daily after lunch.  60 each  0  . [DISCONTINUED] Ferrous Fum-Iron Polysacch-FA 162-115.2-1 MG CAPS Take 1 capsule by mouth daily with supper.  30 each  1  . [DISCONTINUED] methocarbamol (ROBAXIN) 500 MG tablet Take 1 tablet (500 mg total) by mouth 3 (three) times daily.  60 tablet  1  . [DISCONTINUED] mometasone (NASONEX) 50 MCG/ACT nasal spray Place 4 sprays into the nose daily.  17 g  12  . [DISCONTINUED] nabumetone (RELAFEN) 750 MG tablet Take 1 tablet (750 mg total) by mouth 2 (two) times daily as needed for pain (headache).  60 tablet  2  . [DISCONTINUED] omeprazole (PRILOSEC) 40 MG capsule Take 1 capsule (40 mg total) by mouth daily.  30 capsule  3  . [DISCONTINUED] oxyCODONE-acetaminophen (PERCOCET) 7.5-325 MG per tablet Take 1 tablet by mouth every 4 (four) hours as needed.  30 tablet  0  . [DISCONTINUED] traZODone (DESYREL) 50 MG tablet Take 0.5-1 tablets (25-50 mg total) by mouth at bedtime as needed for sleep.  30 tablet  3   No facility-administered encounter medications on file as of 08/27/2013.     No orders of the defined types were placed in this encounter.    No Follow-up on file.

## 2013-08-29 ENCOUNTER — Encounter: Payer: Self-pay | Admitting: Internal Medicine

## 2013-08-29 NOTE — Assessment & Plan Note (Signed)
recurrewnt secondary to husband's death one year ago.  Prn clonazepam refill given

## 2013-08-29 NOTE — Assessment & Plan Note (Signed)
Advised to quit smoking an dirrigate sinuses regularly

## 2013-08-29 NOTE — Assessment & Plan Note (Signed)
Smoking cessation instruction/counseling given:  counseled patient on the dangers of tobacco use, advised patient to stop smoking, and reviewed strategies to maximize success 

## 2013-09-21 ENCOUNTER — Other Ambulatory Visit: Payer: Self-pay

## 2013-09-21 DIAGNOSIS — Z1231 Encounter for screening mammogram for malignant neoplasm of breast: Secondary | ICD-10-CM

## 2013-10-22 ENCOUNTER — Ambulatory Visit
Admission: RE | Admit: 2013-10-22 | Discharge: 2013-10-22 | Disposition: A | Discharge status: Home / Self Care | Payer: BC Managed Care – PPO | Source: Ambulatory Visit

## 2013-10-22 DIAGNOSIS — Z1231 Encounter for screening mammogram for malignant neoplasm of breast: Secondary | ICD-10-CM

## 2013-10-26 ENCOUNTER — Other Ambulatory Visit: Payer: Self-pay

## 2013-10-26 DIAGNOSIS — Z1231 Encounter for screening mammogram for malignant neoplasm of breast: Secondary | ICD-10-CM

## 2013-12-15 ENCOUNTER — Other Ambulatory Visit: Payer: Self-pay | Admitting: Internal Medicine

## 2013-12-16 ENCOUNTER — Telehealth: Payer: Self-pay | Admitting: *Deleted

## 2013-12-16 NOTE — Telephone Encounter (Signed)
Refill Request  Combivent 18-103 mcg has been discontinued, New formulation is Combivent respirnat 20-200 mcg, please send a new e-rx

## 2013-12-17 MED ORDER — IPRATROPIUM-ALBUTEROL 20-100 MCG/ACT IN AERS: 1.0000 | INHALATION_SPRAY | Freq: Four times a day (QID) | RESPIRATORY_TRACT | Status: AC

## 2013-12-17 NOTE — Telephone Encounter (Signed)
done

## 2014-04-18 ENCOUNTER — Telehealth: Payer: Self-pay | Admitting: Internal Medicine

## 2014-04-18 NOTE — Telephone Encounter (Signed)
Patient Information:  Caller Name: Jenny  Phone: [phone]  Patient: Claudia Young, Claudia Young  Gender: Female  DOB: 04-09-1961  Age: 53 Years  PCP: Duncan Dull (Adults only)  Pregnant: No  Office Follow Up:  Does the office need to follow up with this patient?: No  Instructions For The Office: N/A   Symptoms  Reason For Call & Symptoms: Onset of back pain on Thursday 6/4,  pain lower back on both sides but left side is worse and sometimes sharp pain radiates into left leg.  Noting muscle spasms at times.  Pain gradually worse, difficulty sleeping last night 6/7 due to pain.  Was unable to lift left leg to cross legs yesterday in church.  Reviewed Health History In EMR: Yes  Reviewed Medications In EMR: Yes  Reviewed Allergies In EMR: Yes  Reviewed Surgeries / Procedures: Yes  Date of Onset of Symptoms: 04/14/2014  Treatments Tried: heating pad, Advil  Treatments Tried Worked: No OB / GYN:  LMP: Unknown  Guideline(s) Used:  Back Pain  Disposition Per Guideline:   See Today or Tomorrow in Office  Reason For Disposition Reached:   Pain radiates into the thigh or further down the leg  Advice Given:  Reassurance:  Twisting or heavy lifting can cause back pain.  With treatment, the pain most often goes away in 1-2 weeks.  Cold or Heat:  Heat Pack: If pain lasts over 2 days, apply heat to the sore area. Use a heat pack, heating pad, or warm wet washcloth. Do this for 10 minutes, then as needed. For widespread stiffness, take a hot bath or hot shower instead. Move the sore area under the warm water.  Sleep:  Sleep on your side with a pillow between your knees. If you sleep on your back, put a pillow under your knees.  Avoid sleeping on your stomach.  Your mattress should be firm. Avoid waterbeds.  Activity  Keep doing your day-to-day activities if it is not too painful. Staying active is better than resting.  Avoid anything that makes your pain worse. Avoid heavy lifting, twisting,  and too much exercise until your back heals.  Call Back If:  Numbness or weakness occur  Bowel/bladder problems occur  You become worse.  Patient Will Follow Care Advice:  YES  Appointment Scheduled:  04/19/2014 15:30:00 Appointment Scheduled Provider:  Duncan Dull (Adults only)

## 2014-04-19 ENCOUNTER — Telehealth: Payer: Self-pay | Admitting: Internal Medicine

## 2014-04-19 ENCOUNTER — Ambulatory Visit: Payer: Self-pay | Admitting: Internal Medicine

## 2014-04-19 ENCOUNTER — Other Ambulatory Visit: Payer: Self-pay | Admitting: *Deleted

## 2014-04-19 MED ORDER — CLONAZEPAM 1 MG PO TABS
1.0000 mg | ORAL_TABLET | Freq: Two times a day (BID) | ORAL | Status: AC | PRN
Start: 2014-04-19 — End: ?

## 2014-04-19 NOTE — Telephone Encounter (Signed)
clonazePAM (KLONOPIN) 1 MG tablet 

## 2014-04-19 NOTE — Telephone Encounter (Signed)
Last visit 08/27/13. Left message for pt to call and schedule appt, over 6 months since last

## 2014-04-20 NOTE — Telephone Encounter (Signed)
Rx faxed to pharmacy  

## 2014-09-07 ENCOUNTER — Other Ambulatory Visit: Payer: Self-pay | Admitting: Internal Medicine

## 2014-09-08 NOTE — Telephone Encounter (Signed)
Electronic Rx request for Clonazepam received. Patient's last office visit was 08/27/13 and medication last filled 04/19/14. Note to pharmacy stated "No additional refills until office visit". Called patient, no answer left detailed voicemail on cell phone to inform patient that we will not be able to approve Rx request without an office visit. Left callback number for patient to be able to call to schedule appointment.

## 2014-10-24 ENCOUNTER — Ambulatory Visit: Payer: BC Managed Care – PPO

## 2014-10-24 ENCOUNTER — Ambulatory Visit
Admission: RE | Admit: 2014-10-24 | Discharge: 2014-10-24 | Disposition: A | Discharge status: Home / Self Care | Payer: BC Managed Care – PPO | Source: Ambulatory Visit

## 2014-10-24 DIAGNOSIS — Z1231 Encounter for screening mammogram for malignant neoplasm of breast: Secondary | ICD-10-CM

## 2015-01-16 ENCOUNTER — Other Ambulatory Visit: Payer: Self-pay | Admitting: Internal Medicine

## 2015-09-08 ENCOUNTER — Other Ambulatory Visit: Payer: Self-pay

## 2015-09-08 DIAGNOSIS — Z1231 Encounter for screening mammogram for malignant neoplasm of breast: Secondary | ICD-10-CM

## 2015-10-27 ENCOUNTER — Ambulatory Visit
Admission: RE | Admit: 2015-10-27 | Discharge: 2015-10-27 | Disposition: A | Discharge status: Home / Self Care | Payer: BC Managed Care – PPO | Source: Ambulatory Visit

## 2015-10-27 DIAGNOSIS — Z1231 Encounter for screening mammogram for malignant neoplasm of breast: Secondary | ICD-10-CM

## 2016-09-18 ENCOUNTER — Other Ambulatory Visit: Payer: Self-pay | Admitting: Obstetrics and Gynecology

## 2016-09-18 DIAGNOSIS — Z1231 Encounter for screening mammogram for malignant neoplasm of breast: Secondary | ICD-10-CM

## 2016-10-28 ENCOUNTER — Ambulatory Visit
Admission: RE | Admit: 2016-10-28 | Discharge: 2016-10-28 | Disposition: A | Discharge status: Home / Self Care | Payer: BC Managed Care – PPO | Source: Ambulatory Visit | Attending: Obstetrics and Gynecology | Admitting: Obstetrics and Gynecology

## 2016-10-28 DIAGNOSIS — Z1231 Encounter for screening mammogram for malignant neoplasm of breast: Secondary | ICD-10-CM

## 2017-10-10 ENCOUNTER — Other Ambulatory Visit: Payer: Self-pay | Admitting: Obstetrics and Gynecology

## 2017-10-10 DIAGNOSIS — Z1231 Encounter for screening mammogram for malignant neoplasm of breast: Secondary | ICD-10-CM

## 2017-11-10 ENCOUNTER — Ambulatory Visit
Admission: RE | Admit: 2017-11-10 | Discharge: 2017-11-10 | Disposition: A | Discharge status: Home / Self Care | Payer: BC Managed Care – PPO | Source: Ambulatory Visit | Attending: Obstetrics and Gynecology | Admitting: Obstetrics and Gynecology

## 2017-11-10 DIAGNOSIS — Z1231 Encounter for screening mammogram for malignant neoplasm of breast: Secondary | ICD-10-CM

## 2019-07-02 ENCOUNTER — Other Ambulatory Visit: Payer: Self-pay | Admitting: Obstetrics and Gynecology

## 2019-07-02 ENCOUNTER — Other Ambulatory Visit: Payer: Self-pay | Admitting: Family Medicine

## 2019-07-02 DIAGNOSIS — Z1231 Encounter for screening mammogram for malignant neoplasm of breast: Secondary | ICD-10-CM

## 2019-08-20 ENCOUNTER — Ambulatory Visit
Admission: RE | Admit: 2019-08-20 | Discharge: 2019-08-20 | Disposition: A | Discharge status: Home / Self Care | Payer: BC Managed Care – PPO | Source: Ambulatory Visit | Attending: Obstetrics and Gynecology | Admitting: Obstetrics and Gynecology

## 2019-08-20 ENCOUNTER — Other Ambulatory Visit: Payer: Self-pay

## 2019-08-20 DIAGNOSIS — Z1231 Encounter for screening mammogram for malignant neoplasm of breast: Secondary | ICD-10-CM

## 2020-05-19 ENCOUNTER — Other Ambulatory Visit: Payer: Self-pay | Admitting: Obstetrics and Gynecology

## 2020-05-19 DIAGNOSIS — Z1231 Encounter for screening mammogram for malignant neoplasm of breast: Secondary | ICD-10-CM

## 2020-08-25 ENCOUNTER — Other Ambulatory Visit: Payer: Self-pay

## 2020-08-25 ENCOUNTER — Ambulatory Visit
Admission: RE | Admit: 2020-08-25 | Discharge: 2020-08-25 | Disposition: A | Discharge status: Home / Self Care | Payer: BC Managed Care – PPO | Source: Ambulatory Visit | Attending: Obstetrics and Gynecology | Admitting: Obstetrics and Gynecology

## 2020-08-25 DIAGNOSIS — Z1231 Encounter for screening mammogram for malignant neoplasm of breast: Secondary | ICD-10-CM

## 2021-10-08 ENCOUNTER — Other Ambulatory Visit: Payer: Self-pay | Admitting: Obstetrics and Gynecology

## 2021-10-08 DIAGNOSIS — Z1231 Encounter for screening mammogram for malignant neoplasm of breast: Secondary | ICD-10-CM

## 2021-10-12 ENCOUNTER — Ambulatory Visit
Admission: RE | Admit: 2021-10-12 | Discharge: 2021-10-12 | Disposition: A | Discharge status: Home / Self Care | Payer: BC Managed Care – PPO | Source: Ambulatory Visit | Attending: Obstetrics and Gynecology | Admitting: Obstetrics and Gynecology

## 2021-10-12 DIAGNOSIS — Z1231 Encounter for screening mammogram for malignant neoplasm of breast: Secondary | ICD-10-CM

## 2022-08-24 IMAGING — MG MM DIGITAL SCREENING BILAT W/ TOMO AND CAD
8 series · 9 of 24 positions shown · non-contrast
Comparison: Previous exam(s).

CLINICAL DATA: Screening.

EXAM:
DIGITAL SCREENING BILATERAL MAMMOGRAM WITH TOMOSYNTHESIS AND CAD
TECHNIQUE: Bilateral screening digital craniocaudal and mediolateral oblique
mammograms were obtained. Bilateral screening digital breast
tomosynthesis was performed. The images were evaluated with
computer-aided detection.

[R MLO synth-2D]
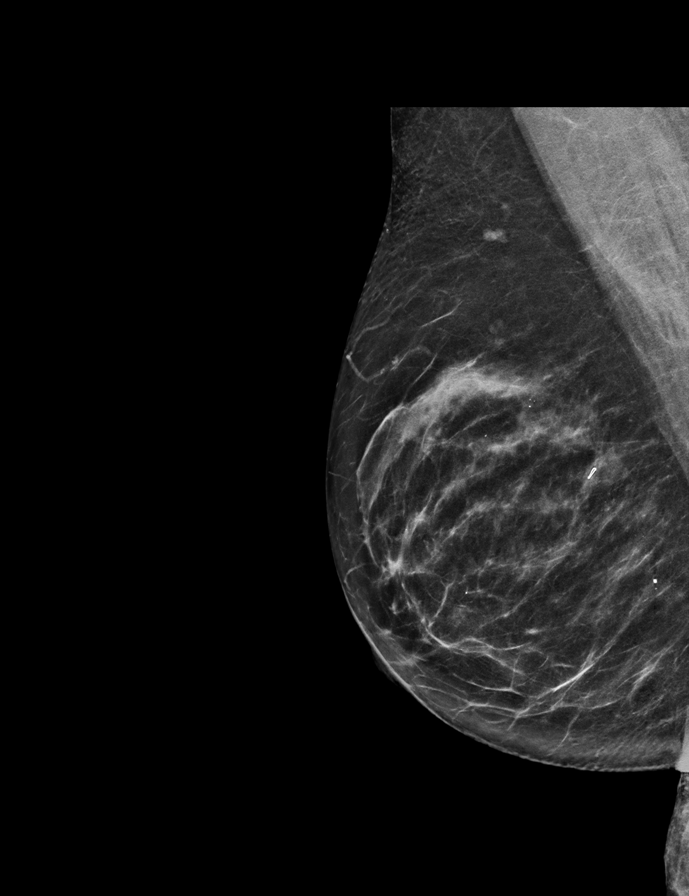

[R CC synth-2D]
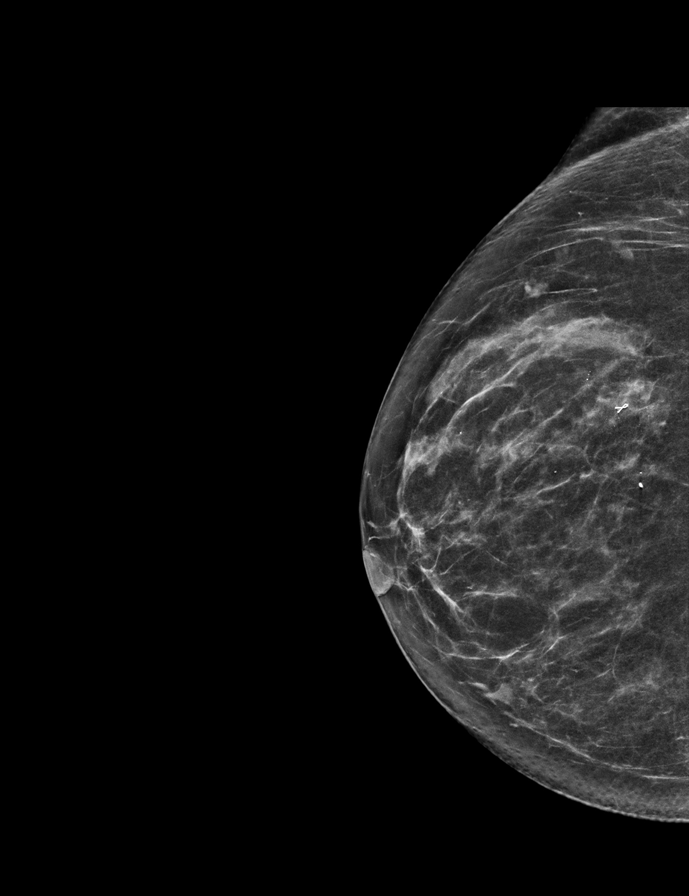

[L CC synth-2D]
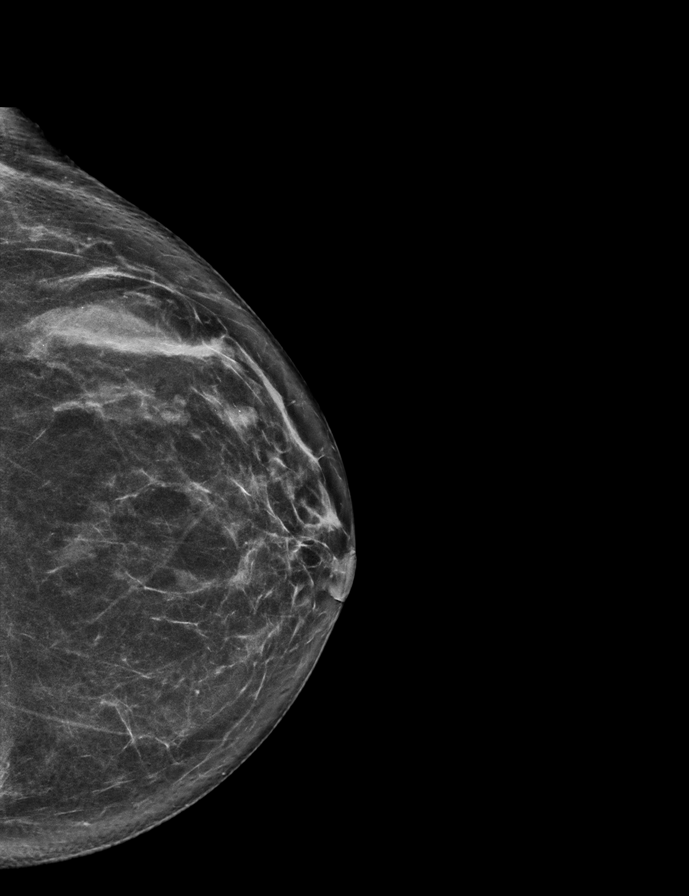

[L MLO synth-2D]
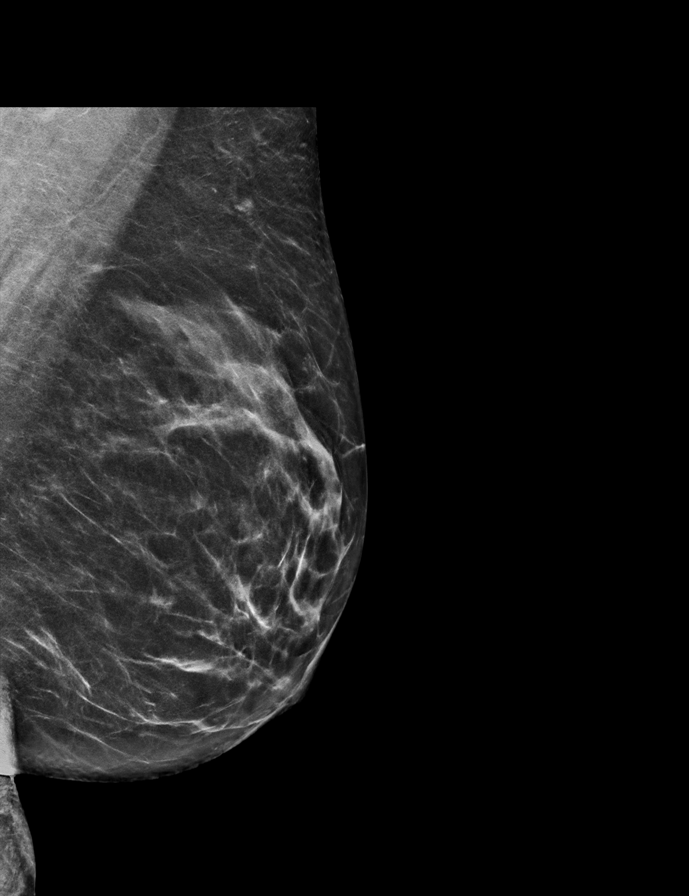

[L MLO tomo · 2 of 68 frames shown]
[frame 22/68]
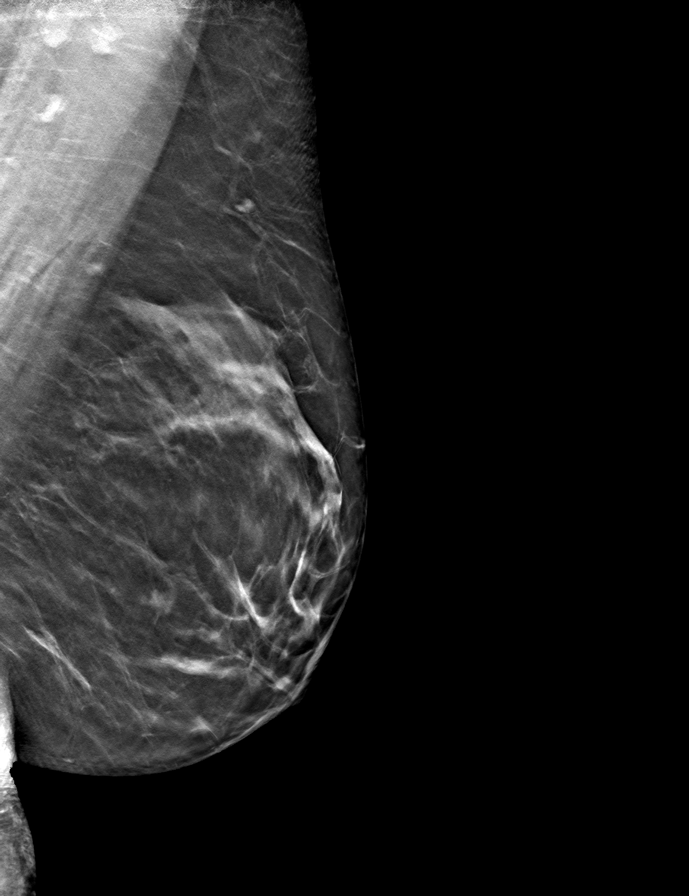
[frame 35/68]
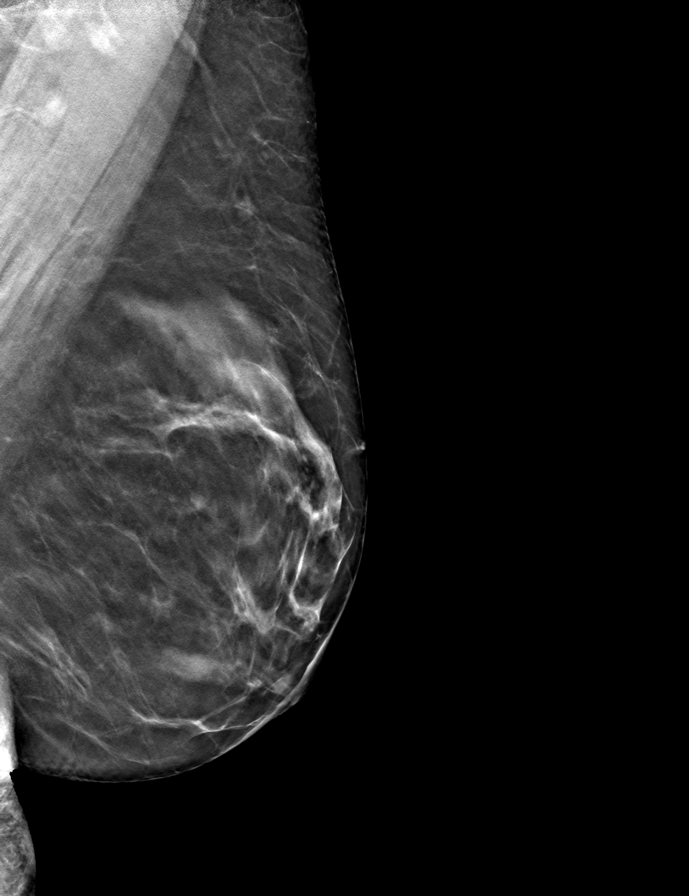

[R MLO tomo · tomo slice 33/66.0]
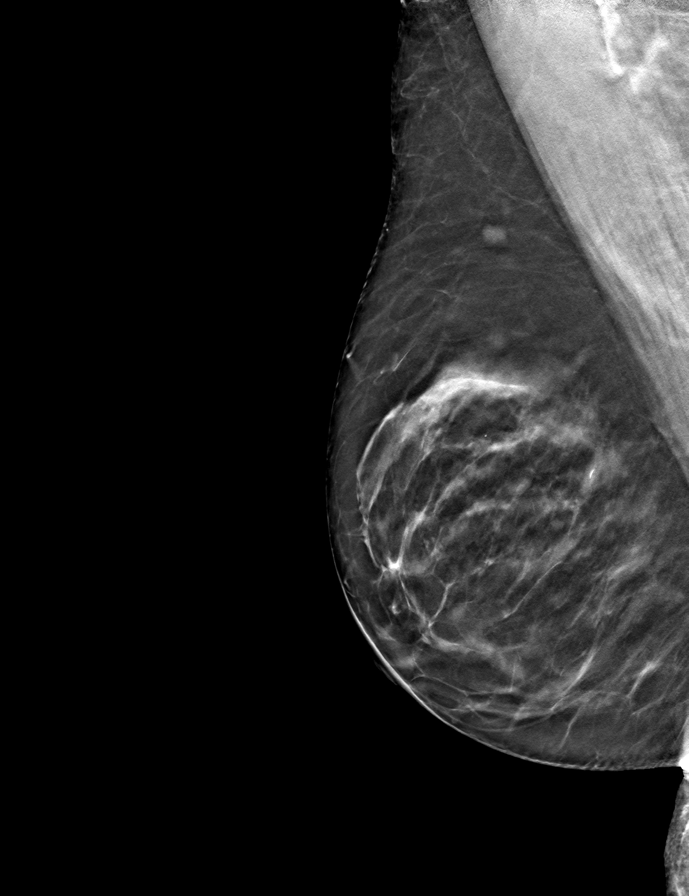

[L CC tomo · tomo slice 33/65.0]
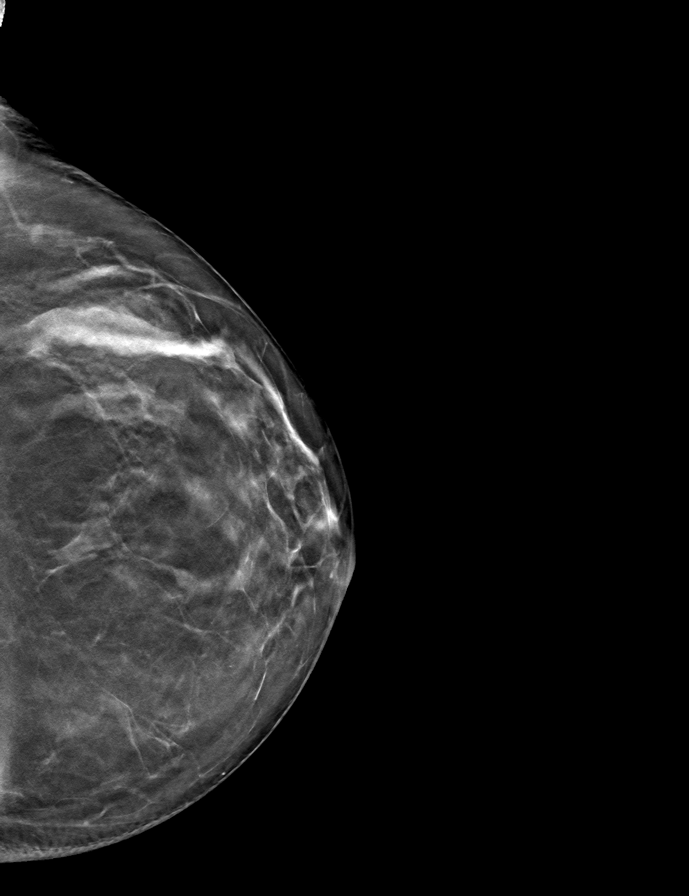

[R CC tomo · tomo slice 33/64.0]
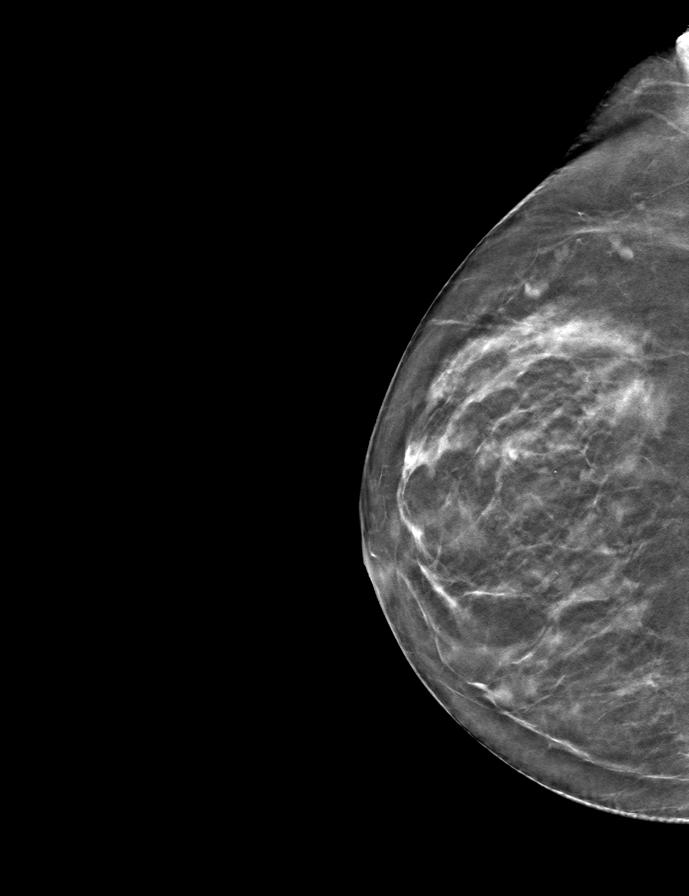

[9 of 24 positions shown; findings below may reference images not displayed]

ACR Breast Density Category c: The breast tissue is heterogeneously
dense, which may obscure small masses.
FINDINGS: There are no findings suspicious for malignancy.
IMPRESSION: No mammographic evidence of malignancy. A result letter of this
screening mammogram will be mailed directly to the patient.

RECOMMENDATION:
Screening mammogram in one year. (Code:Q3-W-BC3)

BI-RADS CATEGORY  1: Negative.

## 2022-09-18 ENCOUNTER — Other Ambulatory Visit: Payer: Self-pay | Admitting: Obstetrics and Gynecology

## 2022-09-18 DIAGNOSIS — Z1231 Encounter for screening mammogram for malignant neoplasm of breast: Secondary | ICD-10-CM

## 2022-11-22 ENCOUNTER — Ambulatory Visit
Admission: RE | Admit: 2022-11-22 | Discharge: 2022-11-22 | Disposition: A | Discharge status: Home / Self Care | Payer: BC Managed Care – PPO | Source: Ambulatory Visit | Attending: Obstetrics and Gynecology | Admitting: Obstetrics and Gynecology

## 2022-11-22 DIAGNOSIS — Z1231 Encounter for screening mammogram for malignant neoplasm of breast: Secondary | ICD-10-CM
